# Patient Record
Sex: Female | Born: 1960 | Race: White | Hispanic: No | Marital: Married | Smoking: Never smoker
Health system: Southern US, Community
[De-identification: ages and names within clinical notes are randomized; demographics above are authoritative.]

## PROBLEM LIST (undated history)

## (undated) DIAGNOSIS — M1990 Unspecified osteoarthritis, unspecified site: Secondary | ICD-10-CM

## (undated) DIAGNOSIS — M4800 Spinal stenosis, site unspecified: Secondary | ICD-10-CM

## (undated) HISTORY — DX: Spinal stenosis, site unspecified: M48.00

## (undated) HISTORY — PX: TOOTH EXTRACTION: SUR596

## (undated) HISTORY — DX: Unspecified osteoarthritis, unspecified site: M19.90

## (undated) HISTORY — PX: WISDOM TOOTH EXTRACTION: SHX21

## (undated) HISTORY — PX: OTHER SURGICAL HISTORY: SHX169

---

## 2007-06-15 ENCOUNTER — Ambulatory Visit: Payer: Self-pay | Admitting: Internal Medicine

## 2007-09-08 ENCOUNTER — Encounter: Payer: Self-pay | Admitting: Neurological Surgery

## 2007-09-27 ENCOUNTER — Encounter: Payer: Self-pay | Admitting: Neurological Surgery

## 2008-08-26 HISTORY — PX: ABDOMINAL HYSTERECTOMY: SHX81

## 2012-06-08 LAB — HM MAMMOGRAPHY: HM Mammogram: NEGATIVE

## 2013-05-20 ENCOUNTER — Other Ambulatory Visit: Payer: Self-pay

## 2013-05-20 DIAGNOSIS — Z1231 Encounter for screening mammogram for malignant neoplasm of breast: Secondary | ICD-10-CM

## 2013-07-07 ENCOUNTER — Ambulatory Visit
Admission: RE | Admit: 2013-07-07 | Discharge: 2013-07-07 | Disposition: A | Discharge status: Home / Self Care | Payer: BC Managed Care – PPO | Source: Ambulatory Visit

## 2013-07-07 DIAGNOSIS — Z1231 Encounter for screening mammogram for malignant neoplasm of breast: Secondary | ICD-10-CM

## 2013-08-31 ENCOUNTER — Encounter: Payer: Self-pay | Admitting: Family Medicine

## 2013-08-31 ENCOUNTER — Ambulatory Visit (INDEPENDENT_AMBULATORY_CARE_PROVIDER_SITE_OTHER): Payer: BC Managed Care – PPO | Admitting: Family Medicine

## 2013-08-31 VITALS — BP 126/78 | HR 92 | Temp 97.8°F | Ht 67.0 in | Wt 217.5 lb

## 2013-08-31 DIAGNOSIS — E669 Obesity, unspecified: Secondary | ICD-10-CM

## 2013-08-31 MED ORDER — PHENTERMINE HCL 15 MG PO CAPS: 15.0000 mg | ORAL_CAPSULE | ORAL | Status: DC

## 2013-08-31 NOTE — Patient Instructions (Signed)
Good to see you. Please come see me in one month for follow up and complete physical exam.  We will check labs at that time.

## 2013-08-31 NOTE — Assessment & Plan Note (Signed)
Discussed options. She would like to hold off on nutritionist and is willing to start slow physical activity. Discussed risks and benefits of phentermine and she would like to try it. Rx given for phentermine 15 mg daily. Follow up in 1 month- CPX at that time.

## 2013-08-31 NOTE — Progress Notes (Signed)
   Subjective:    Patient ID: Madeline Ross, female    DOB: Dec 04, 1960, 53 y.o.   MRN: [id number]  HPI  Very pleasant 53 yo female here to establish care and to discuss weight loss.  Has had issues with her weight for years but feels she weighs more than ever. Tried weight watchers in past. Loves sweets. Does not exercise. Does not have high blood pressure, DM or heart disease.  Mother had ovarian cancer- pt s/p hysterectomy and bilateral oophorectomy in 2010 due to menorrhagia and family h/o ovarian CA.  Patient Active Problem List   Diagnosis Date Noted  . Obesity, unspecified 08/31/2013   Past Medical History  Diagnosis Date  . Arthritis   . Spinal stenosis    Past Surgical History  Procedure Laterality Date  . Abdominal hysterectomy  2010  . Tooth extraction    . Wisdom tooth extraction    . Oophrectomy Bilateral    History  Substance Use Topics  . Smoking status: Never Smoker   . Smokeless tobacco: Not on file  . Alcohol Use: No   Family History  Problem Relation Age of Onset  . Arthritis Mother   . Cancer Mother   . Heart disease Mother   . Stroke Mother   . Arthritis Father   . Cancer Father   . Hypertension Father   . Diabetes Maternal Uncle   . Arthritis Maternal Grandmother   . Heart disease Maternal Grandmother   . Arthritis Maternal Grandfather   . Heart disease Maternal Grandfather   . Arthritis Paternal Grandmother   . Arthritis Paternal Grandfather    No Known Allergies No current outpatient prescriptions on file prior to visit.   No current facility-administered medications on file prior to visit.   The PMH, PSH, Social History, Family History, Medications, and allergies [facility], and have been updated if relevant.    Review of Systems See HPI No n/v/d No CP No SOB    Objective:   Physical Exam  Constitutional: She is oriented to person, place, and time. She appears well-developed and well-nourished. No distress.   Neck: Normal range of motion. Neck supple.  Cardiovascular: Normal rate, regular rhythm and normal heart sounds.   Pulmonary/Chest: Effort normal and breath sounds normal.  Abdominal: Soft.  Musculoskeletal: Normal range of motion.  Neurological: She is alert and oriented to person, place, and time. She has normal reflexes.  Skin: Skin is warm and dry.  Psychiatric: She has a normal mood and affect. Her behavior is normal. Judgment and thought content normal.   BP 126/78  Pulse 92  Temp(Src) 97.8 F (36.6 C) (Oral)  Ht 5\' 7"  (1.702 m)  Wt 217 lb 8 oz (98.657 kg)  BMI 34.06 kg/m2  SpO2 96%        Assessment & Plan:

## 2013-08-31 NOTE — Progress Notes (Signed)
Pre-visit discussion using our clinic review tool. No additional management support is needed unless otherwise documented below in the visit note.  

## 2013-09-27 ENCOUNTER — Encounter: Payer: Self-pay | Admitting: Family Medicine

## 2013-10-05 ENCOUNTER — Encounter: Payer: Self-pay | Admitting: Family Medicine

## 2013-10-05 ENCOUNTER — Ambulatory Visit (INDEPENDENT_AMBULATORY_CARE_PROVIDER_SITE_OTHER): Payer: BC Managed Care – PPO | Admitting: Family Medicine

## 2013-10-05 VITALS — BP 124/80 | HR 76 | Temp 97.7°F | Wt 203.2 lb

## 2013-10-05 DIAGNOSIS — Z136 Encounter for screening for cardiovascular disorders: Secondary | ICD-10-CM

## 2013-10-05 DIAGNOSIS — Z0000 Encounter for general adult medical examination without abnormal findings: Secondary | ICD-10-CM

## 2013-10-05 DIAGNOSIS — E669 Obesity, unspecified: Secondary | ICD-10-CM

## 2013-10-05 LAB — CBC WITH DIFFERENTIAL/PLATELET
BASOS PCT: 0.3 % (ref 0.0–3.0)
Basophils Absolute: 0 10*3/uL (ref 0.0–0.1)
Eosinophils Absolute: 0.1 10*3/uL (ref 0.0–0.7)
Eosinophils Relative: 1.1 % (ref 0.0–5.0)
HCT: 45 % (ref 36.0–46.0)
Hemoglobin: 14.9 g/dL (ref 12.0–15.0)
LYMPHS PCT: 34.9 % (ref 12.0–46.0)
Lymphs Abs: 2.7 10*3/uL (ref 0.7–4.0)
MCHC: 33.2 g/dL (ref 30.0–36.0)
MCV: 87.4 fl (ref 78.0–100.0)
Monocytes Absolute: 0.6 10*3/uL (ref 0.1–1.0)
Monocytes Relative: 8.1 % (ref 3.0–12.0)
NEUTROS PCT: 55.6 % (ref 43.0–77.0)
Neutro Abs: 4.3 10*3/uL (ref 1.4–7.7)
Platelets: 264 10*3/uL (ref 150.0–400.0)
RBC: 5.15 Mil/uL — ABNORMAL HIGH (ref 3.87–5.11)
RDW: 13.1 % (ref 11.5–14.6)
WBC: 7.8 10*3/uL (ref 4.5–10.5)

## 2013-10-05 LAB — LIPID PANEL
CHOLESTEROL: 131 mg/dL (ref 0–200)
HDL: 43.7 mg/dL (ref >39.00)
LDL Cholesterol: 73 mg/dL (ref 0–99)
Total CHOL/HDL Ratio: 3
Triglycerides: 71 mg/dL (ref 0.0–149.0)
VLDL: 14.2 mg/dL (ref 0.0–40.0)

## 2013-10-05 LAB — COMPREHENSIVE METABOLIC PANEL
ALT: 24 U/L (ref 0–35)
AST: 17 U/L (ref 0–37)
Albumin: 4 g/dL (ref 3.5–5.2)
Alkaline Phosphatase: 72 U/L (ref 39–117)
BUN: 12 mg/dL (ref 6–23)
CALCIUM: 9 mg/dL (ref 8.4–10.5)
CHLORIDE: 107 meq/L (ref 96–112)
CO2: 25 meq/L (ref 19–32)
Creatinine, Ser: 0.8 mg/dL (ref 0.4–1.2)
GFR: 82.47 mL/min (ref >60.00)
GLUCOSE: 91 mg/dL (ref 70–99)
POTASSIUM: 4.1 meq/L (ref 3.5–5.1)
Sodium: 140 mEq/L (ref 135–145)
Total Bilirubin: 0.8 mg/dL (ref 0.3–1.2)
Total Protein: 7.2 g/dL (ref 6.0–8.3)

## 2013-10-05 LAB — TSH: TSH: 1.65 u[IU]/mL (ref 0.35–5.50)

## 2013-10-05 MED ORDER — PHENTERMINE HCL 30 MG PO CAPS: 30.0000 mg | ORAL_CAPSULE | ORAL | Status: DC

## 2013-10-05 NOTE — Patient Instructions (Signed)
Great to see you. You look great!  I am so proud of you.  Please come see me in 1 month.

## 2013-10-05 NOTE — Progress Notes (Signed)
Pre-visit discussion using our clinic review tool. No additional management support is needed unless otherwise documented below in the visit note.  

## 2013-10-05 NOTE — Progress Notes (Signed)
   Subjective:    Patient ID: Madeline Ross, female    DOB: 02-Feb-1961, 53 y.o.   MRN: [id number]  HPI  Very pleasant 53 yo female here for one month follow up weight loss.   Tried weight watchers in past. Loves sweets. Does not exercise. Does not have high blood pressure, DM or heart disease.  Started phentermine 15 mg daily last month.  Feels she is doing very well.  Appetite has decreased and she is more active.  Eating more salads.  Wt Readings from Last 3 Encounters:  10/05/13 203 lb 4 oz (92.194 kg)  08/31/13 217 lb 8 oz (98.657 kg)     Patient Active Problem List   Diagnosis Date Noted  . Obesity, unspecified 08/31/2013   Past Medical History  Diagnosis Date  . Arthritis   . Spinal stenosis    Past Surgical History  Procedure Laterality Date  . Abdominal hysterectomy  2010  . Tooth extraction    . Wisdom tooth extraction    . Oophrectomy Bilateral    History  Substance Use Topics  . Smoking status: Never Smoker   . Smokeless tobacco: Not on file  . Alcohol Use: No   Family History  Problem Relation Age of Onset  . Arthritis Mother   . Cancer Mother   . Heart disease Mother   . Stroke Mother   . Arthritis Father   . Cancer Father   . Hypertension Father   . Diabetes Maternal Uncle   . Arthritis Maternal Grandmother   . Heart disease Maternal Grandmother   . Arthritis Maternal Grandfather   . Heart disease Maternal Grandfather   . Arthritis Paternal Grandmother   . Arthritis Paternal Grandfather    No Known Allergies Current Outpatient Prescriptions on File Prior to Visit  Medication Sig Dispense Refill  . aspirin 81 MG tablet Take 81 mg by mouth daily.      . Multiple Vitamins-Minerals (MULTIVITAMIN WITH MINERALS) tablet Take 1 tablet by mouth daily.      . phentermine 15 MG capsule Take 1 capsule (15 mg total) by mouth every morning.  30 capsule  0   No current facility-administered medications on file prior to visit.   The PMH, PSH,  Social History, Family History, Medications, and allergies [facility], and have been updated if relevant.    Review of Systems See HPI No n/v/d No CP No SOB No palpitations    Objective:   Physical Exam  Constitutional: She is oriented to person, place, and time. She appears well-developed and well-nourished. No distress.  Neck: Normal range of motion. Neck supple.  Cardiovascular: Normal rate, regular rhythm and normal heart sounds.   Pulmonary/Chest: Effort normal and breath sounds normal.  Abdominal: Soft.  Musculoskeletal: Normal range of motion.  Neurological: She is alert and oriented to person, place, and time. She has normal reflexes.  Skin: Skin is warm and dry.  Psychiatric: She has a normal mood and affect. Her behavior is normal. Judgment and thought content normal.   BP 124/80  Pulse 76  Temp(Src) 97.7 F (36.5 C) (Oral)  Wt 203 lb 4 oz (92.194 kg)  SpO2 98%        Assessment & Plan:

## 2013-10-05 NOTE — Assessment & Plan Note (Signed)
Improved. She would like to increase dose- rx given to pt for phentermine 30 mg daily. Follow up in 1 month.

## 2013-10-06 ENCOUNTER — Encounter: Payer: Self-pay | Admitting: *Deleted

## 2013-11-02 ENCOUNTER — Encounter: Payer: Self-pay | Admitting: Family Medicine

## 2013-11-02 ENCOUNTER — Ambulatory Visit (INDEPENDENT_AMBULATORY_CARE_PROVIDER_SITE_OTHER): Payer: BC Managed Care – PPO | Admitting: Family Medicine

## 2013-11-02 VITALS — BP 134/70 | HR 87 | Temp 97.7°F | Ht 67.0 in | Wt 201.5 lb

## 2013-11-02 DIAGNOSIS — E669 Obesity, unspecified: Secondary | ICD-10-CM

## 2013-11-02 DIAGNOSIS — Z0000 Encounter for general adult medical examination without abnormal findings: Secondary | ICD-10-CM

## 2013-11-02 MED ORDER — PHENTERMINE HCL 37.5 MG PO CAPS: 37.5000 mg | ORAL_CAPSULE | ORAL | Status: DC

## 2013-11-02 NOTE — Patient Instructions (Signed)
Great to see you. You look great. Start exercising and you feel even better!

## 2013-11-02 NOTE — Progress Notes (Signed)
Pre visit review using our clinic review tool, if applicable. No additional management support is needed unless otherwise documented below in the visit note. 

## 2013-11-02 NOTE — Assessment & Plan Note (Signed)
Doing well.  Not exercising- so I encouraged her to incorporate exercise into her weight loss plan.  She would also like to try increased dose of phentermine.  Rx given to her today for phentermine 37.5 mg daily daily. Follow up in 1 month.

## 2013-11-02 NOTE — Progress Notes (Signed)
Subjective:    Patient ID: Madeline Ross, female    DOB: [birth year], 53 y.o.   MRN: [id number]  HPI  Very pleasant 53 yo female here for CPX.  Mother had ovarian cancer- pt s/p hysterectomy and bilateral oophorectomy in 2010 due to menorrhagia and family h/o ovarian CA. Mammogram 07/15/13 Normal colonoscopy when she turned 51.    Lab Results  Component Value Date   CHOL 131 10/05/2013   HDL 43.70 10/05/2013   LDLCALC 73 10/05/2013   TRIG 71.0 10/05/2013   CHOLHDL 3 10/05/2013   Lab Results  Component Value Date   CREATININE 0.8 10/05/2013   Lab Results  Component Value Date   WBC 7.8 10/05/2013   HGB 14.9 10/05/2013   HCT 45.0 10/05/2013   MCV 87.4 10/05/2013   PLT 264.0 10/05/2013   Lab Results  Component Value Date   NA 140 10/05/2013   K 4.1 10/05/2013   CL 107 10/05/2013   CO2 25 10/05/2013     Has had issues with her weight for years but feels she weighs more than ever. Tried weight watchers in past. Loves sweets. Started phentermine two months ago.  Lost more weight initially- concerned that she has plateau ed.  Denies any CP, SOB, or palpitations. Wt Readings from Last 3 Encounters:  11/02/13 201 lb 8 oz (91.4 kg)  10/05/13 203 lb 4 oz (92.194 kg)  08/31/13 217 lb 8 oz (98.657 kg)       Patient Active Problem List   Diagnosis Date Noted  . Obesity, unspecified 08/31/2013   Past Medical History  Diagnosis Date  . Arthritis   . Spinal stenosis    Past Surgical History  Procedure Laterality Date  . Abdominal hysterectomy  2010  . Tooth extraction    . Wisdom tooth extraction    . Oophrectomy Bilateral    History  Substance Use Topics  . Smoking status: Never Smoker   . Smokeless tobacco: Not on file  . Alcohol Use: No   Family History  Problem Relation Age of Onset  . Arthritis Mother   . Cancer Mother   . Heart disease Mother   . Stroke Mother   . Arthritis Father   . Cancer Father   . Hypertension Father   . Diabetes Maternal Uncle    . Arthritis Maternal Grandmother   . Heart disease Maternal Grandmother   . Arthritis Maternal Grandfather   . Heart disease Maternal Grandfather   . Arthritis Paternal Grandmother   . Arthritis Paternal Grandfather    No Known Allergies Current Outpatient Prescriptions on File Prior to Visit  Medication Sig Dispense Refill  . aspirin 81 MG tablet Take 81 mg by mouth daily.      . Multiple Vitamins-Minerals (MULTIVITAMIN WITH MINERALS) tablet Take 1 tablet by mouth daily.      . phentermine 30 MG capsule Take 1 capsule (30 mg total) by mouth every morning.  30 capsule  0   No current facility-administered medications on file prior to visit.   The PMH, PSH, Social History, Family History, Medications, and allergies [facility], and have been updated if relevant.    Review of Systems Patient reports no  vision/ hearing changes,anorexia, weight change, fever ,adenopathy, persistant / recurrent hoarseness, swallowing issues, chest pain, edema,persistant / recurrent cough, hemoptysis, dyspnea(rest, exertional, paroxysmal nocturnal), gastrointestinal  bleeding (melena, rectal bleeding), abdominal pain, excessive heart burn, GU symptoms(dysuria, hematuria, pyuria, voiding/incontinence  Issues) syncope, focal weakness, severe memory  loss, concerning skin lesions, depression, anxiety, abnormal bruising/bleeding, major joint swelling, breast masses or abnormal vaginal bleeding.    Objective:   Physical Exam  Nursing note and vitals reviewed. Constitutional: She is oriented to person, place, and time. She appears well-developed and well-nourished. No distress.  Neck: Normal range of motion. Neck supple.  Cardiovascular: Normal rate, regular rhythm and normal heart sounds.   Pulmonary/Chest: Effort normal and breath sounds normal.  Abdominal: Soft.  Musculoskeletal: Normal range of motion.  Neurological: She is alert and oriented to person, place, and time. She has normal reflexes.   Skin: Skin is warm and dry.  Psychiatric: She has a normal mood and affect. Her behavior is normal. Judgment and thought content normal.   BP 134/70  Pulse 87  Temp(Src) 97.7 F (36.5 C) (Oral)  Ht 5\' 7"  (1.702 m)  Wt 201 lb 8 oz (91.4 kg)  BMI 31.55 kg/m2  SpO2 98%        Assessment & Plan:

## 2013-11-02 NOTE — Assessment & Plan Note (Signed)
UTD prevention. Reviewed preventive care protocols, scheduled due services, and updated immunizations Discussed nutrition, exercise, diet, and healthy lifestyle.

## 2013-12-01 ENCOUNTER — Encounter: Payer: Self-pay | Admitting: Family Medicine

## 2013-12-01 ENCOUNTER — Ambulatory Visit (INDEPENDENT_AMBULATORY_CARE_PROVIDER_SITE_OTHER): Payer: BC Managed Care – PPO | Admitting: Family Medicine

## 2013-12-01 VITALS — BP 128/78 | HR 74 | Temp 97.4°F | Wt 199.0 lb

## 2013-12-01 DIAGNOSIS — E669 Obesity, unspecified: Secondary | ICD-10-CM

## 2013-12-01 MED ORDER — PHENTERMINE HCL 37.5 MG PO CAPS: 37.5000 mg | ORAL_CAPSULE | ORAL | Status: DC

## 2013-12-01 NOTE — Assessment & Plan Note (Signed)
>  25 minutes spent in face to face time with patient, >50% spent in counselling or coordination of care We discussed ways she could incorporate exercise into her daily life.  Will continue current dose of phentermine.  Follow up in 1 month.

## 2013-12-01 NOTE — Progress Notes (Signed)
   Subjective:    Patient ID: Madeline Ross, female    DOB: [birth year], 53 y.o.   MRN: [id number]  HPI  Very pleasant 53 yo female here for 1 month follow up- obesity.   Has had issues with her weight for years.  Tried weight watchers in past. Loves sweets. Started phentermine three months ago, last month we increased her dose to 37.5 mg daily.   Denies any insomnia, CP, SOB, or palpitations. Wt Readings from Last 3 Encounters:  12/01/13 199 lb (90.266 kg)  11/02/13 201 lb 8 oz (91.4 kg)  10/05/13 203 lb 4 oz (92.194 kg)   She has had more stressors with her mother recently so she has not had time to exercise.    Patient Active Problem List   Diagnosis Date Noted  . Obesity, unspecified 08/31/2013   Past Medical History  Diagnosis Date  . Arthritis   . Spinal stenosis    Past Surgical History  Procedure Laterality Date  . Abdominal hysterectomy  2010  . Tooth extraction    . Wisdom tooth extraction    . Oophrectomy Bilateral    History  Substance Use Topics  . Smoking status: Never Smoker   . Smokeless tobacco: Not on file  . Alcohol Use: No   Family History  Problem Relation Age of Onset  . Arthritis Mother   . Cancer Mother   . Heart disease Mother   . Stroke Mother   . Arthritis Father   . Cancer Father   . Hypertension Father   . Diabetes Maternal Uncle   . Arthritis Maternal Grandmother   . Heart disease Maternal Grandmother   . Arthritis Maternal Grandfather   . Heart disease Maternal Grandfather   . Arthritis Paternal Grandmother   . Arthritis Paternal Grandfather    No Known Allergies Current Outpatient Prescriptions on File Prior to Visit  Medication Sig Dispense Refill  . aspirin 81 MG tablet Take 81 mg by mouth daily.      . Multiple Vitamins-Minerals (MULTIVITAMIN WITH MINERALS) tablet Take 1 tablet by mouth daily.      . phentermine 37.5 MG capsule Take 1 capsule (37.5 mg total) by mouth every morning.  30 capsule  0   No current  facility-administered medications on file prior to visit.   The PMH, PSH, Social History, Family History, Medications, and allergies [facility], and have been updated if relevant.    Review of Systems See HPI Objective:   Physical Exam  Nursing note and vitals reviewed. Constitutional: She is oriented to person, place, and time. She appears well-developed and well-nourished. No distress.  Neck: Normal range of motion. Neck supple.  Cardiovascular: Normal rate, regular rhythm and normal heart sounds.   Pulmonary/Chest: Effort normal and breath sounds normal.  Abdominal: Soft.  Musculoskeletal: Normal range of motion.  Neurological: She is alert and oriented to person, place, and time. She has normal reflexes.  Skin: Skin is warm and dry.  Psychiatric: She has a normal mood and affect. Her behavior is normal. Judgment and thought content normal.   BP 128/78  Pulse 74  Temp(Src) 97.4 F (36.3 C) (Oral)  Wt 199 lb (90.266 kg)  SpO2 98%        Assessment & Plan:

## 2013-12-01 NOTE — Patient Instructions (Signed)
Great to see you. Hang in there.  Thank you so much for my soap.  We will see you next month.

## 2013-12-01 NOTE — Progress Notes (Signed)
Pre visit review using our clinic review tool, if applicable. No additional management support is needed unless otherwise documented below in the visit note. 

## 2013-12-29 ENCOUNTER — Encounter: Payer: Self-pay | Admitting: Family Medicine

## 2013-12-29 ENCOUNTER — Ambulatory Visit (INDEPENDENT_AMBULATORY_CARE_PROVIDER_SITE_OTHER): Payer: BC Managed Care – PPO | Admitting: Family Medicine

## 2013-12-29 VITALS — BP 126/76 | HR 78 | Temp 98.0°F | Wt 193.0 lb

## 2013-12-29 DIAGNOSIS — E669 Obesity, unspecified: Secondary | ICD-10-CM

## 2013-12-29 MED ORDER — PHENTERMINE HCL 37.5 MG PO CAPS: 37.5000 mg | ORAL_CAPSULE | ORAL | Status: DC

## 2013-12-29 NOTE — Progress Notes (Signed)
Pre visit review using our clinic review tool, if applicable. No additional management support is needed unless otherwise documented below in the visit note. 

## 2013-12-29 NOTE — Patient Instructions (Signed)
Great to see you! Try to increase your physical activity.  Come see me in one month.

## 2013-12-29 NOTE — Progress Notes (Signed)
   Subjective:    Patient ID: Madeline Ross, female    DOB: Nov 02, 1960, 53 y.o.   MRN: [id number]  HPI  Very pleasant 53 yo female here for 1 month follow up- obesity.   Has had issues with her weight for years.  Tried weight watchers in past. Loves sweets. On phentermine 37.5 mg daily.   Denies any insomnia, CP, SOB, or palpitations.  She is working in yard but not formally exercising. Wt Readings from Last 3 Encounters:  12/29/13 193 lb (87.544 kg)  12/01/13 199 lb (90.266 kg)  11/02/13 201 lb 8 oz (91.4 kg)   She has had more stressors with her mother recently so she has not had time to exercise.  Does feel she is stress eating less.  Clothes fit better.   Patient Active Problem List   Diagnosis Date Noted  . Obesity, unspecified 08/31/2013   Past Medical History  Diagnosis Date  . Arthritis   . Spinal stenosis    Past Surgical History  Procedure Laterality Date  . Abdominal hysterectomy  2010  . Tooth extraction    . Wisdom tooth extraction    . Oophrectomy Bilateral    History  Substance Use Topics  . Smoking status: Never Smoker   . Smokeless tobacco: Not on file  . Alcohol Use: No   Family History  Problem Relation Age of Onset  . Arthritis Mother   . Cancer Mother   . Heart disease Mother   . Stroke Mother   . Arthritis Father   . Cancer Father   . Hypertension Father   . Diabetes Maternal Uncle   . Arthritis Maternal Grandmother   . Heart disease Maternal Grandmother   . Arthritis Maternal Grandfather   . Heart disease Maternal Grandfather   . Arthritis Paternal Grandmother   . Arthritis Paternal Grandfather    No Known Allergies Current Outpatient Prescriptions on File Prior to Visit  Medication Sig Dispense Refill  . aspirin 81 MG tablet Take 81 mg by mouth daily.      . Multiple Vitamins-Minerals (MULTIVITAMIN WITH MINERALS) tablet Take 1 tablet by mouth daily.      . phentermine 37.5 MG capsule Take 1 capsule (37.5 mg total) by mouth  every morning.  30 capsule  0   No current facility-administered medications on file prior to visit.   The PMH, PSH, Social History, Family History, Medications, and allergies [facility], and have been updated if relevant.    Review of Systems See HPI Objective:   Physical Exam  Nursing note and vitals reviewed. Constitutional: She is oriented to person, place, and time. She appears well-developed and well-nourished. No distress.  Neck: Normal range of motion. Neck supple.  Cardiovascular: Normal rate, regular rhythm and normal heart sounds.   Pulmonary/Chest: Effort normal and breath sounds normal.  Abdominal: Soft.  Musculoskeletal: Normal range of motion.  Neurological: She is alert and oriented to person, place, and time. She has normal reflexes.  Skin: Skin is warm and dry.  Psychiatric: She has a normal mood and affect. Her behavior is normal. Judgment and thought content normal.   BP 126/76  Pulse 78  Temp(Src) 98 F (36.7 C) (Oral)  Wt 193 lb (87.544 kg)  SpO2 99%        Assessment & Plan:

## 2013-12-29 NOTE — Assessment & Plan Note (Signed)
Improved.  BMI 30. Continue phentermine 37.5 mg daily.  Follow up in 1 month.  If BMI < 27 will decrease to half dose x 1 month then stop

## 2014-01-26 ENCOUNTER — Encounter: Payer: Self-pay | Admitting: Family Medicine

## 2014-01-26 ENCOUNTER — Ambulatory Visit (INDEPENDENT_AMBULATORY_CARE_PROVIDER_SITE_OTHER): Payer: BC Managed Care – PPO | Admitting: Family Medicine

## 2014-01-26 ENCOUNTER — Encounter (INDEPENDENT_AMBULATORY_CARE_PROVIDER_SITE_OTHER): Payer: Self-pay

## 2014-01-26 VITALS — BP 122/72 | HR 85 | Temp 97.7°F | Wt 192.0 lb

## 2014-01-26 DIAGNOSIS — E669 Obesity, unspecified: Secondary | ICD-10-CM

## 2014-01-26 DIAGNOSIS — H029 Unspecified disorder of eyelid: Secondary | ICD-10-CM

## 2014-01-26 MED ORDER — PHENTERMINE HCL 37.5 MG PO CAPS: 37.5000 mg | ORAL_CAPSULE | ORAL | Status: DC

## 2014-01-26 NOTE — Assessment & Plan Note (Signed)
Phentermine rx refilled. Follow up in 1 month.

## 2014-01-26 NOTE — Patient Instructions (Signed)
Please stop by to see Madeline Ross on your way out. 

## 2014-01-26 NOTE — Progress Notes (Signed)
   Subjective:    Patient ID: Madeline Ross, female    DOB: 10-26-1960, 53 y.o.   MRN: [id number]  HPI  Very pleasant 53 yo female here for 1 month follow up- obesity.   Has had issues with her weight for years.  Tried weight watchers in past. Loves sweets. On phentermine 37.5 mg daily.   Denies any insomnia, CP, SOB, or palpitations.  She is working in yard but not formally exercising. Wt Readings from Last 3 Encounters:  01/26/14 192 lb (87.091 kg)  12/29/13 193 lb (87.544 kg)  12/01/13 199 lb (90.266 kg)   She has had more stressors with her mother recently so she has not had time to exercise.  Does feel she is stress eating less.  Clothes fit better.  Has a right eye lid lesion- was told she needs Mohs surgery.   Patient Active Problem List   Diagnosis Date Noted  . Obesity, unspecified 08/31/2013   Past Medical History  Diagnosis Date  . Arthritis   . Spinal stenosis    Past Surgical History  Procedure Laterality Date  . Abdominal hysterectomy  2010  . Tooth extraction    . Wisdom tooth extraction    . Oophrectomy Bilateral    History  Substance Use Topics  . Smoking status: Never Smoker   . Smokeless tobacco: Not on file  . Alcohol Use: No   Family History  Problem Relation Age of Onset  . Arthritis Mother   . Cancer Mother   . Heart disease Mother   . Stroke Mother   . Arthritis Father   . Cancer Father   . Hypertension Father   . Diabetes Maternal Uncle   . Arthritis Maternal Grandmother   . Heart disease Maternal Grandmother   . Arthritis Maternal Grandfather   . Heart disease Maternal Grandfather   . Arthritis Paternal Grandmother   . Arthritis Paternal Grandfather    No Known Allergies Current Outpatient Prescriptions on File Prior to Visit  Medication Sig Dispense Refill  . aspirin 81 MG tablet Take 81 mg by mouth daily.      . Multiple Vitamins-Minerals (MULTIVITAMIN WITH MINERALS) tablet Take 1 tablet by mouth daily.       No current  facility-administered medications on file prior to visit.   The PMH, PSH, Social History, Family History, Medications, and allergies have been reviewed in [facility], and have been updated if relevant.    Review of Systems See HPI Objective:   Physical Exam  Nursing note and vitals reviewed. Constitutional: She is oriented to person, place, and time. She appears well-developed and well-nourished. No distress.  Neck: Normal range of motion. Neck supple.  Cardiovascular: Normal rate, regular rhythm and normal heart sounds.   Pulmonary/Chest: Effort normal and breath sounds normal.  Abdominal: Soft.  Musculoskeletal: Normal range of motion.  Neurological: She is alert and oriented to person, place, and time. She has normal reflexes.  Skin: Skin is warm and dry.  Psychiatric: She has a normal mood and affect. Her behavior is normal. Judgment and thought content normal.   Right eye lid- circular, flesh colored lesion BP 122/72  Pulse 85  Temp(Src) 97.7 F (36.5 C) (Oral)  Wt 192 lb (87.091 kg)  SpO2 98%        Assessment & Plan:

## 2014-01-26 NOTE — Progress Notes (Signed)
Pre visit review using our clinic review tool, if applicable. No additional management support is needed unless otherwise documented below in the visit note. 

## 2014-02-21 ENCOUNTER — Other Ambulatory Visit: Payer: Self-pay

## 2014-02-21 MED ORDER — PHENTERMINE HCL 37.5 MG PO CAPS: 37.5000 mg | ORAL_CAPSULE | ORAL | Status: DC

## 2014-02-21 NOTE — Telephone Encounter (Signed)
Ok to phone in.

## 2014-02-21 NOTE — Telephone Encounter (Signed)
Pt had f/u appt on 02/22/14 but pt cannot come and cancelled appt; pt rescheduled for 03/14/14 with Dr Dayton MartesAron; this was first available because pt's vacation time. Pt request rx phentermine.pt request cb.

## 2014-02-21 NOTE — Telephone Encounter (Signed)
Spoke to pt and informed her Rx has been faxed to requested pharmacy 

## 2014-02-22 ENCOUNTER — Ambulatory Visit: Payer: BC Managed Care – PPO | Admitting: Family Medicine

## 2014-03-07 ENCOUNTER — Ambulatory Visit: Payer: BC Managed Care – PPO | Admitting: Family Medicine

## 2014-03-14 ENCOUNTER — Ambulatory Visit: Payer: BC Managed Care – PPO | Admitting: Family Medicine

## 2014-03-15 ENCOUNTER — Ambulatory Visit: Payer: BC Managed Care – PPO | Admitting: Family Medicine

## 2014-05-03 ENCOUNTER — Other Ambulatory Visit: Payer: Self-pay

## 2014-05-03 ENCOUNTER — Ambulatory Visit (INDEPENDENT_AMBULATORY_CARE_PROVIDER_SITE_OTHER): Payer: BC Managed Care – PPO | Admitting: *Deleted

## 2014-05-03 DIAGNOSIS — Z1231 Encounter for screening mammogram for malignant neoplasm of breast: Secondary | ICD-10-CM

## 2014-05-03 DIAGNOSIS — Z23 Encounter for immunization: Secondary | ICD-10-CM

## 2014-07-12 ENCOUNTER — Ambulatory Visit
Admission: RE | Admit: 2014-07-12 | Discharge: 2014-07-12 | Disposition: A | Discharge status: Home / Self Care | Payer: BC Managed Care – PPO | Source: Ambulatory Visit

## 2014-07-12 ENCOUNTER — Encounter (INDEPENDENT_AMBULATORY_CARE_PROVIDER_SITE_OTHER): Payer: Self-pay

## 2014-07-12 DIAGNOSIS — Z1231 Encounter for screening mammogram for malignant neoplasm of breast: Secondary | ICD-10-CM

## 2015-05-24 ENCOUNTER — Other Ambulatory Visit: Payer: Self-pay

## 2015-05-24 DIAGNOSIS — Z1231 Encounter for screening mammogram for malignant neoplasm of breast: Secondary | ICD-10-CM

## 2015-07-18 ENCOUNTER — Ambulatory Visit: Payer: Self-pay

## 2015-11-01 ENCOUNTER — Ambulatory Visit: Payer: Self-pay

## 2015-12-13 ENCOUNTER — Ambulatory Visit: Payer: Self-pay

## 2015-12-26 ENCOUNTER — Ambulatory Visit: Payer: Self-pay | Admitting: Nurse Practitioner

## 2016-01-23 ENCOUNTER — Ambulatory Visit
Admission: RE | Admit: 2016-01-23 | Discharge: 2016-01-23 | Disposition: A | Discharge status: Home / Self Care | Payer: BLUE CROSS/BLUE SHIELD | Source: Ambulatory Visit

## 2016-01-23 DIAGNOSIS — Z1231 Encounter for screening mammogram for malignant neoplasm of breast: Secondary | ICD-10-CM

## 2016-05-15 ENCOUNTER — Encounter: Payer: Self-pay | Admitting: Family Medicine

## 2016-05-15 ENCOUNTER — Ambulatory Visit (INDEPENDENT_AMBULATORY_CARE_PROVIDER_SITE_OTHER): Payer: BLUE CROSS/BLUE SHIELD | Admitting: Family Medicine

## 2016-05-15 VITALS — BP 136/78 | HR 72 | Temp 98.1°F | Ht 67.0 in | Wt 210.5 lb

## 2016-05-15 DIAGNOSIS — Z23 Encounter for immunization: Secondary | ICD-10-CM

## 2016-05-15 DIAGNOSIS — Z0000 Encounter for general adult medical examination without abnormal findings: Secondary | ICD-10-CM | POA: Diagnosis not present

## 2016-05-15 DIAGNOSIS — Z01419 Encounter for gynecological examination (general) (routine) without abnormal findings: Secondary | ICD-10-CM | POA: Insufficient documentation

## 2016-05-15 LAB — CBC WITH DIFFERENTIAL/PLATELET
Basophils Absolute: 0 10*3/uL (ref 0.0–0.1)
Basophils Relative: 0.4 % (ref 0.0–3.0)
EOS ABS: 0.1 10*3/uL (ref 0.0–0.7)
Eosinophils Relative: 1.4 % (ref 0.0–5.0)
HEMATOCRIT: 43.1 % (ref 36.0–46.0)
HEMOGLOBIN: 14.8 g/dL (ref 12.0–15.0)
LYMPHS PCT: 30.9 % (ref 12.0–46.0)
Lymphs Abs: 2.5 10*3/uL (ref 0.7–4.0)
MCHC: 34.4 g/dL (ref 30.0–36.0)
MCV: 85.3 fl (ref 78.0–100.0)
Monocytes Absolute: 0.6 10*3/uL (ref 0.1–1.0)
Monocytes Relative: 7.3 % (ref 3.0–12.0)
Neutro Abs: 4.9 10*3/uL (ref 1.4–7.7)
Neutrophils Relative %: 60 % (ref 43.0–77.0)
Platelets: 279 10*3/uL (ref 150.0–400.0)
RBC: 5.05 Mil/uL (ref 3.87–5.11)
RDW: 13.2 % (ref 11.5–15.5)
WBC: 8.2 10*3/uL (ref 4.0–10.5)

## 2016-05-15 LAB — LIPID PANEL
CHOLESTEROL: 144 mg/dL (ref 0–200)
HDL: 48.8 mg/dL (ref >39.00)
LDL Cholesterol: 82 mg/dL (ref 0–99)
NonHDL: 94.99
TRIGLYCERIDES: 65 mg/dL (ref 0.0–149.0)
Total CHOL/HDL Ratio: 3
VLDL: 13 mg/dL (ref 0.0–40.0)

## 2016-05-15 LAB — COMPREHENSIVE METABOLIC PANEL
ALBUMIN: 4 g/dL (ref 3.5–5.2)
ALK PHOS: 85 U/L (ref 39–117)
ALT: 19 U/L (ref 0–35)
AST: 17 U/L (ref 0–37)
BUN: 14 mg/dL (ref 6–23)
CALCIUM: 8.9 mg/dL (ref 8.4–10.5)
CHLORIDE: 107 meq/L (ref 96–112)
CO2: 29 mEq/L (ref 19–32)
CREATININE: 0.81 mg/dL (ref 0.40–1.20)
GFR: 78.18 mL/min (ref >60.00)
Glucose, Bld: 90 mg/dL (ref 70–99)
POTASSIUM: 4.2 meq/L (ref 3.5–5.1)
Sodium: 141 mEq/L (ref 135–145)
TOTAL PROTEIN: 7 g/dL (ref 6.0–8.3)
Total Bilirubin: 0.5 mg/dL (ref 0.2–1.2)

## 2016-05-15 LAB — TSH: TSH: 1.82 u[IU]/mL (ref 0.35–4.50)

## 2016-05-15 MED ORDER — PHENTERMINE HCL 37.5 MG PO CAPS: 37.5000 mg | ORAL_CAPSULE | ORAL | 0 refills | Status: AC

## 2016-05-15 NOTE — Assessment & Plan Note (Signed)
Reviewed preventive care protocols, scheduled due services, and updated immunizations Discussed nutrition, exercise, diet, and healthy lifestyle.  Orders Placed This Encounter  Procedures  . Flu Vaccine QUAD 36+ mos PF IM (Fluarix & Fluzone Quad PF)  . CBC with Differential/Platelet  . Comprehensive metabolic panel  . Lipid panel  . TSH  . Hepatitis C Antibody  . HIV antibody (with reflex)

## 2016-05-15 NOTE — Patient Instructions (Addendum)
Good to see you. We will call you with your lab results and you can view them online.  We are restarting phentermine. Please come see me in 1 month.

## 2016-05-15 NOTE — Progress Notes (Signed)
Subjective:    Patient ID: Madeline Ross, female    DOB: July 30, 1961, 55 y.o.   MRN: [id number]  HPI  Very pleasant 55 yo female here for CPX.  Mother had ovarian cancer- pt s/p hysterectomy and bilateral oophorectomy in 2010 due to menorrhagia and family h/o ovarian CA. Mammogram 01/24/16 Normal colonoscopy when she turned 51.  Continues to gain weight.  A lot of stressors with her mom. Phentermine did help in past without side effects.  Wt Readings from Last 3 Encounters:  05/15/16 210 lb 8 oz (95.5 kg)  01/26/14 192 lb (87.1 kg)  12/29/13 193 lb (87.5 kg)    Lab Results  Component Value Date   CHOL 131 10/05/2013   HDL 43.70 10/05/2013   LDLCALC 73 10/05/2013   TRIG 71.0 10/05/2013   CHOLHDL 3 10/05/2013   Lab Results  Component Value Date   CREATININE 0.8 10/05/2013   Lab Results  Component Value Date   WBC 7.8 10/05/2013   HGB 14.9 10/05/2013   HCT 45.0 10/05/2013   MCV 87.4 10/05/2013   PLT 264.0 10/05/2013   Lab Results  Component Value Date   NA 140 10/05/2013   K 4.1 10/05/2013   CL 107 10/05/2013   CO2 25 10/05/2013     Has had issues with her weight for years but feels she weighs more than ever. Tried weight watchers in past. Loves sweets. Started phentermine two months ago.  Lost more weight initially- concerned that she has plateau ed.  Denies any CP, SOB, or palpitations. Wt Readings from Last 3 Encounters:  05/15/16 210 lb 8 oz (95.5 kg)  01/26/14 192 lb (87.1 kg)  12/29/13 193 lb (87.5 kg)       Patient Active Problem List   Diagnosis Date Noted  . Well woman exam 05/15/2016   Past Medical History:  Diagnosis Date  . Arthritis   . Spinal stenosis    Past Surgical History:  Procedure Laterality Date  . ABDOMINAL HYSTERECTOMY  2010  . oophrectomy Bilateral   . TOOTH EXTRACTION    . WISDOM TOOTH EXTRACTION     Social History  Substance Use Topics  . Smoking status: Never Smoker  . Smokeless tobacco: Not on file  .  Alcohol use No   Family History  Problem Relation Age of Onset  . Arthritis Mother   . Cancer Mother   . Heart disease Mother   . Stroke Mother   . Arthritis Father   . Cancer Father   . Hypertension Father   . Diabetes Maternal Uncle   . Arthritis Maternal Grandmother   . Heart disease Maternal Grandmother   . Arthritis Maternal Grandfather   . Heart disease Maternal Grandfather   . Arthritis Paternal Grandmother   . Arthritis Paternal Grandfather    No Known Allergies Current Outpatient Prescriptions on File Prior to Visit  Medication Sig Dispense Refill  . aspirin 81 MG tablet Take 81 mg by mouth daily.    . Multiple Vitamins-Minerals (MULTIVITAMIN WITH MINERALS) tablet Take 1 tablet by mouth daily.     No current facility-administered medications on file prior to visit.    The PMH, PSH, Social History, Family History, Medications, and allergies have been reviewed in [facility], and have been updated if relevant.    Review of Systems  Constitutional: Negative.   HENT: Negative.   Eyes: Negative.   Respiratory: Negative.   Cardiovascular: Negative.   Gastrointestinal: Negative.   Endocrine: Negative.   Musculoskeletal:  Negative.   Skin: Negative.   Allergic/Immunologic: Negative.   Neurological: Negative.   Hematological: Negative.   Psychiatric/Behavioral: Negative.   All other systems reviewed and are negative.    Objective:   Physical Exam  Constitutional: She is oriented to person, place, and time. She appears well-developed and well-nourished. No distress.  Neck: Normal range of motion. Neck supple.  Cardiovascular: Normal rate, regular rhythm and normal heart sounds.   Pulmonary/Chest: Effort normal and breath sounds normal.  Abdominal: Soft.  Musculoskeletal: Normal range of motion.  Neurological: She is alert and oriented to person, place, and time. She has normal reflexes.  Skin: Skin is warm and dry.  Psychiatric: She has a normal mood and affect. Her  behavior is normal. Judgment and thought content normal.  Nursing note and vitals reviewed.  BP 136/78   Pulse 72   Temp 98.1 F (36.7 C) (Oral)   Ht 5\' 7"  (1.702 m)   Wt 210 lb 8 oz (95.5 kg)   SpO2 97%   BMI 32.97 kg/m         Assessment & Plan:

## 2016-05-15 NOTE — Assessment & Plan Note (Signed)
Discussed weight loss plan. Pt would also like to discuss medication options- discussed phentermine risk benefits, side effects including HTN, pulmonary HTN, stroke.    She would like to restart phentermine and lifestyle changes.  Follow up in 1 month.  If BMI < 27 will decrease to half dose x 1 month then stop   

## 2016-05-15 NOTE — Progress Notes (Signed)
Pre visit review using our clinic review tool, if applicable. No additional management support is needed unless otherwise documented below in the visit note. 

## 2016-05-16 LAB — HIV ANTIBODY (ROUTINE TESTING W REFLEX): HIV 1&2 Ab, 4th Generation: NONREACTIVE

## 2016-05-16 LAB — HEPATITIS C ANTIBODY: HCV Ab: NEGATIVE

## 2016-05-17 ENCOUNTER — Encounter: Payer: Self-pay | Admitting: *Deleted

## 2016-06-12 ENCOUNTER — Ambulatory Visit: Payer: BLUE CROSS/BLUE SHIELD | Admitting: Family Medicine

## 2016-07-01 ENCOUNTER — Ambulatory Visit: Payer: BLUE CROSS/BLUE SHIELD | Admitting: Family Medicine

## 2017-10-30 ENCOUNTER — Other Ambulatory Visit: Payer: Self-pay | Admitting: Family Medicine

## 2017-10-30 DIAGNOSIS — Z139 Encounter for screening, unspecified: Secondary | ICD-10-CM

## 2018-01-05 ENCOUNTER — Ambulatory Visit: Payer: BLUE CROSS/BLUE SHIELD

## 2018-04-06 ENCOUNTER — Ambulatory Visit: Payer: BLUE CROSS/BLUE SHIELD

## 2018-05-19 ENCOUNTER — Ambulatory Visit: Payer: Self-pay

## 2018-06-08 ENCOUNTER — Ambulatory Visit
Admission: RE | Admit: 2018-06-08 | Discharge: 2018-06-08 | Disposition: A | Discharge status: Home / Self Care | Payer: BLUE CROSS/BLUE SHIELD | Source: Ambulatory Visit | Attending: Family Medicine | Admitting: Family Medicine

## 2018-06-08 DIAGNOSIS — Z139 Encounter for screening, unspecified: Secondary | ICD-10-CM

## 2019-07-26 ENCOUNTER — Other Ambulatory Visit: Payer: Self-pay | Admitting: Family Medicine

## 2019-07-26 DIAGNOSIS — Z1231 Encounter for screening mammogram for malignant neoplasm of breast: Secondary | ICD-10-CM

## 2019-09-15 ENCOUNTER — Other Ambulatory Visit: Payer: Self-pay

## 2019-09-15 ENCOUNTER — Ambulatory Visit
Admission: RE | Admit: 2019-09-15 | Discharge: 2019-09-15 | Disposition: A | Discharge status: Home / Self Care | Payer: 59 | Source: Ambulatory Visit | Attending: Family Medicine | Admitting: Family Medicine

## 2019-09-15 DIAGNOSIS — Z1231 Encounter for screening mammogram for malignant neoplasm of breast: Secondary | ICD-10-CM

## 2021-03-26 ENCOUNTER — Other Ambulatory Visit: Payer: Self-pay | Admitting: Family Medicine

## 2021-03-26 DIAGNOSIS — Z1231 Encounter for screening mammogram for malignant neoplasm of breast: Secondary | ICD-10-CM

## 2021-05-15 ENCOUNTER — Ambulatory Visit
Admission: RE | Admit: 2021-05-15 | Discharge: 2021-05-15 | Disposition: A | Discharge status: Home / Self Care | Payer: BC Managed Care – PPO | Source: Ambulatory Visit | Attending: Family Medicine | Admitting: Family Medicine

## 2021-05-15 ENCOUNTER — Other Ambulatory Visit: Payer: Self-pay

## 2021-05-15 DIAGNOSIS — Z1231 Encounter for screening mammogram for malignant neoplasm of breast: Secondary | ICD-10-CM

## 2022-04-17 ENCOUNTER — Other Ambulatory Visit: Payer: Self-pay | Admitting: Orthopedic Surgery

## 2022-04-17 DIAGNOSIS — M25362 Other instability, left knee: Secondary | ICD-10-CM

## 2022-04-17 DIAGNOSIS — M25462 Effusion, left knee: Secondary | ICD-10-CM

## 2022-05-06 ENCOUNTER — Ambulatory Visit: Payer: Worker's Compensation

## 2022-05-13 ENCOUNTER — Encounter
Admission: RE | Admit: 2022-05-13 | Discharge: 2022-05-13 | Disposition: A | Discharge status: Home / Self Care | Payer: Worker's Compensation | Source: Ambulatory Visit | Attending: Orthopedic Surgery | Admitting: Orthopedic Surgery

## 2022-05-13 DIAGNOSIS — M25462 Effusion, left knee: Secondary | ICD-10-CM | POA: Diagnosis present

## 2022-05-13 DIAGNOSIS — M25362 Other instability, left knee: Secondary | ICD-10-CM | POA: Insufficient documentation

## 2022-05-13 MED ORDER — TECHNETIUM TC 99M MEDRONATE IV KIT
20.0000 | PACK | Freq: Once | INTRAVENOUS | Status: AC | PRN
  Administered 2022-05-13: 20.6 via INTRAVENOUS

## 2022-05-22 ENCOUNTER — Other Ambulatory Visit: Payer: Self-pay | Admitting: Family Medicine

## 2022-05-22 DIAGNOSIS — Z1231 Encounter for screening mammogram for malignant neoplasm of breast: Secondary | ICD-10-CM

## 2022-07-01 ENCOUNTER — Ambulatory Visit
Admission: RE | Admit: 2022-07-01 | Discharge: 2022-07-01 | Disposition: A | Discharge status: Home / Self Care | Payer: BC Managed Care – PPO | Source: Ambulatory Visit | Attending: Family Medicine | Admitting: Family Medicine

## 2022-07-01 DIAGNOSIS — Z1231 Encounter for screening mammogram for malignant neoplasm of breast: Secondary | ICD-10-CM

## 2022-07-03 ENCOUNTER — Other Ambulatory Visit: Payer: Self-pay | Admitting: Family Medicine

## 2022-07-03 DIAGNOSIS — R928 Other abnormal and inconclusive findings on diagnostic imaging of breast: Secondary | ICD-10-CM

## 2022-07-17 ENCOUNTER — Ambulatory Visit
Admission: RE | Admit: 2022-07-17 | Discharge: 2022-07-17 | Disposition: A | Discharge status: Home / Self Care | Payer: BC Managed Care – PPO | Source: Ambulatory Visit | Attending: Family Medicine | Admitting: Family Medicine

## 2022-07-17 DIAGNOSIS — R928 Other abnormal and inconclusive findings on diagnostic imaging of breast: Secondary | ICD-10-CM

## 2022-11-13 IMAGING — MG MM DIGITAL SCREENING BILAT W/ TOMO AND CAD
8 series · 8 of 24 positions shown · non-contrast
Comparison: Previous exam(s).

CLINICAL DATA: Screening.

EXAM:
DIGITAL SCREENING BILATERAL MAMMOGRAM WITH TOMOSYNTHESIS AND CAD
TECHNIQUE: Bilateral screening digital craniocaudal and mediolateral oblique
mammograms were obtained. Bilateral screening digital breast
tomosynthesis was performed. The images were evaluated with
computer-aided detection.

[L CC synth-2D]
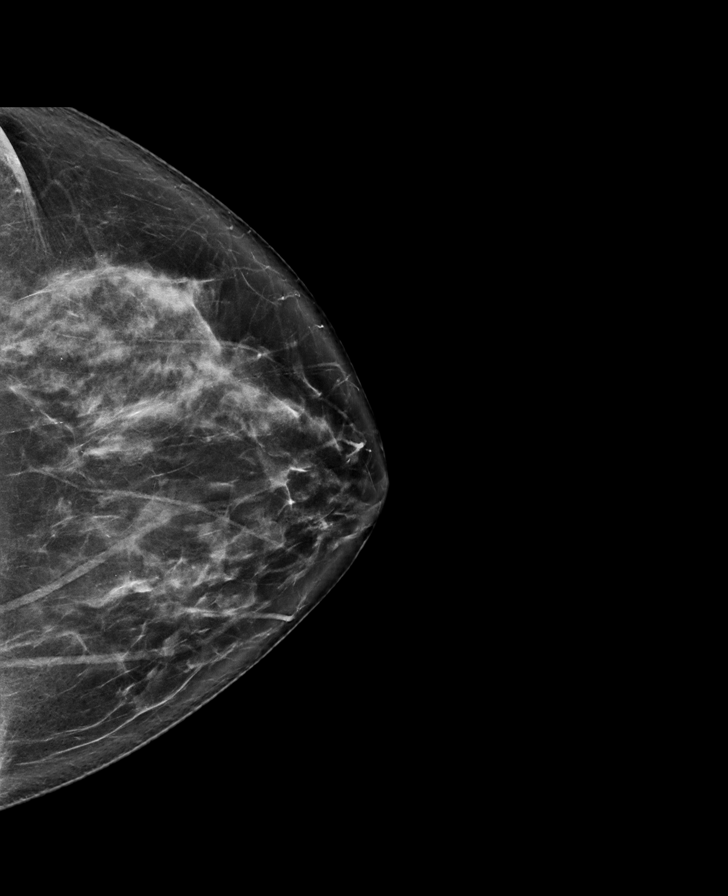

[R CC synth-2D]
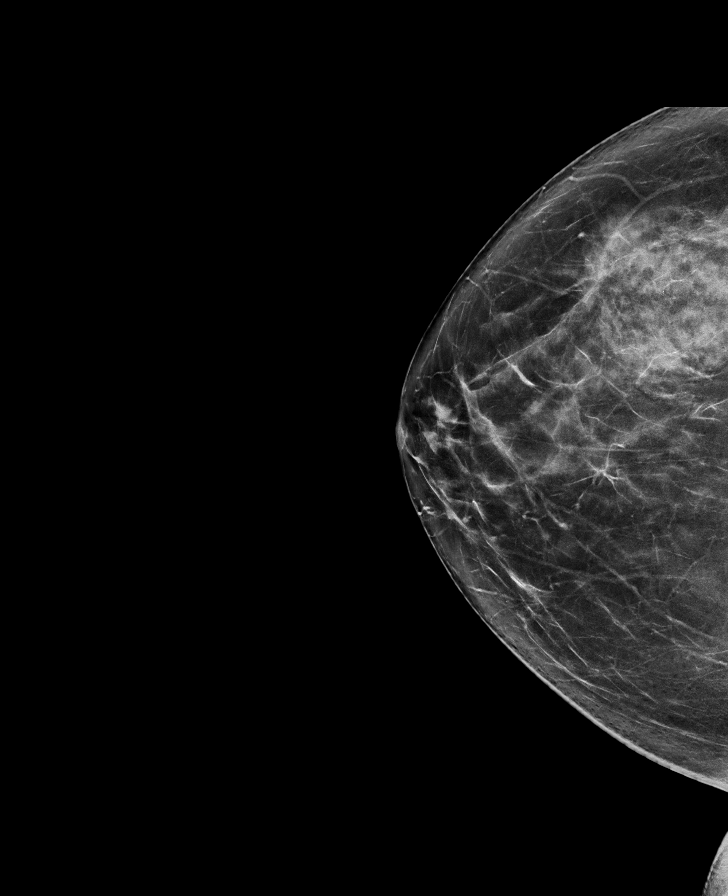

[L MLO synth-2D]
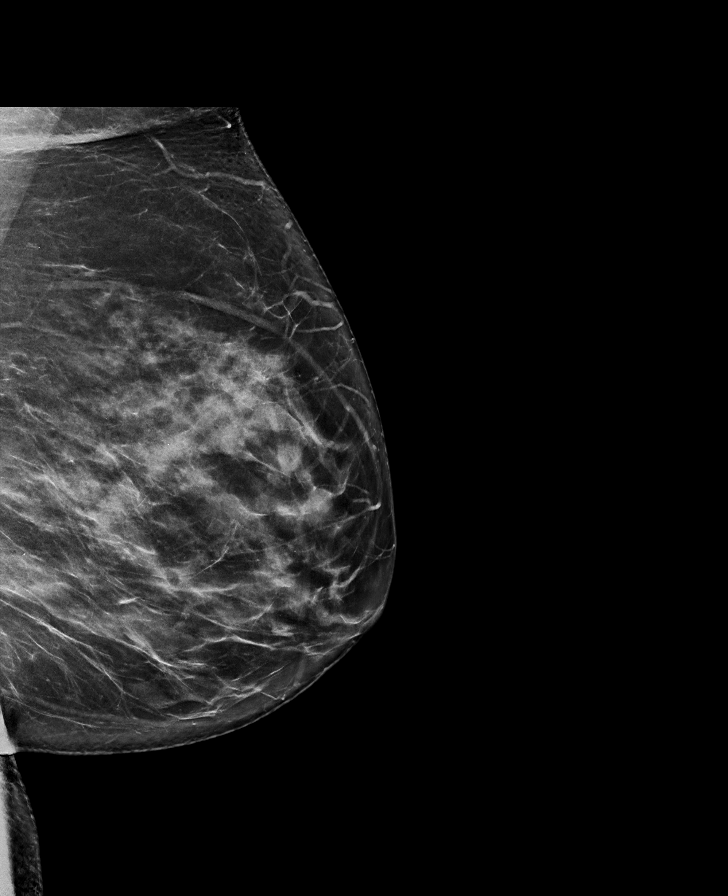

[R MLO synth-2D]
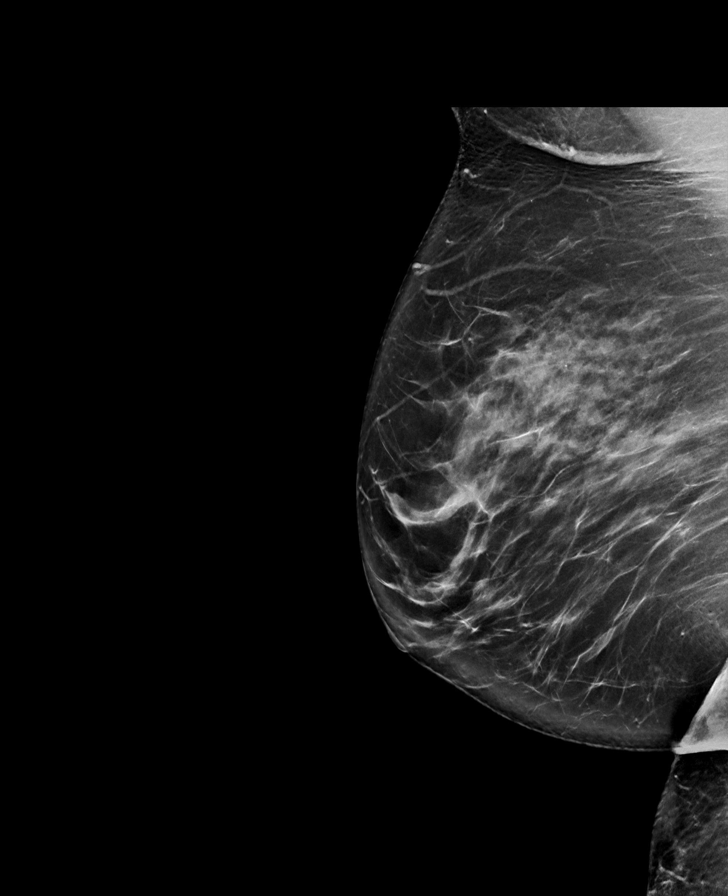

[R MLO tomo · tomo slice 45/89.0]
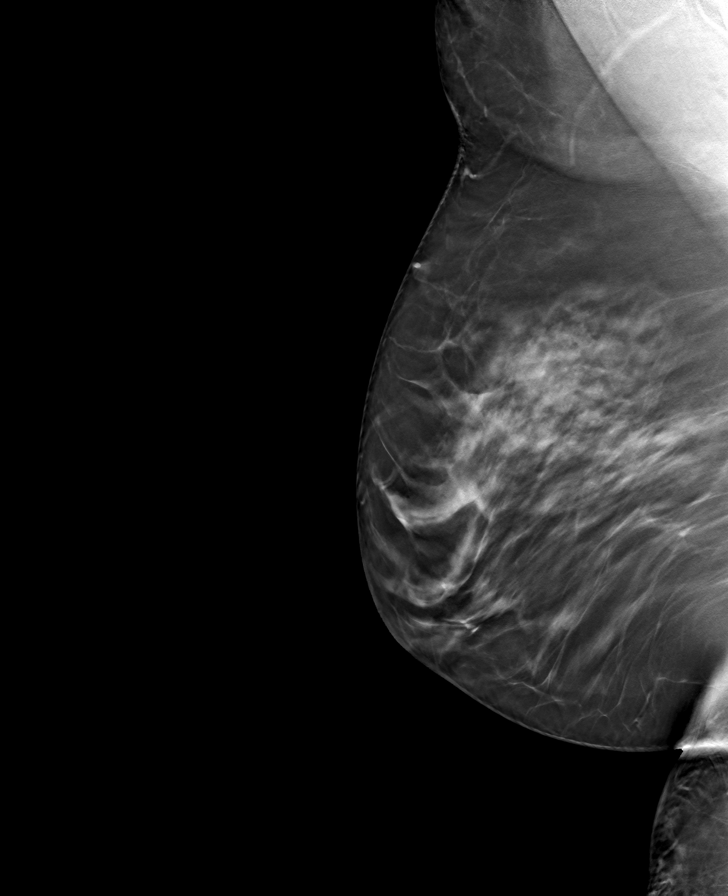

[R CC tomo · tomo slice 40/79.0]
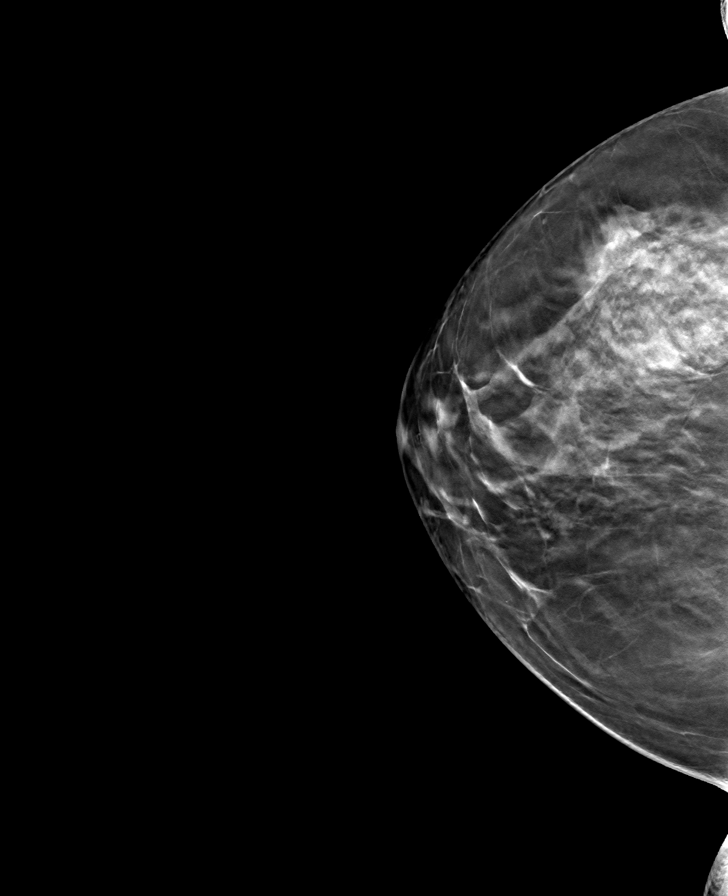

[L CC tomo · tomo slice 42/83.0]
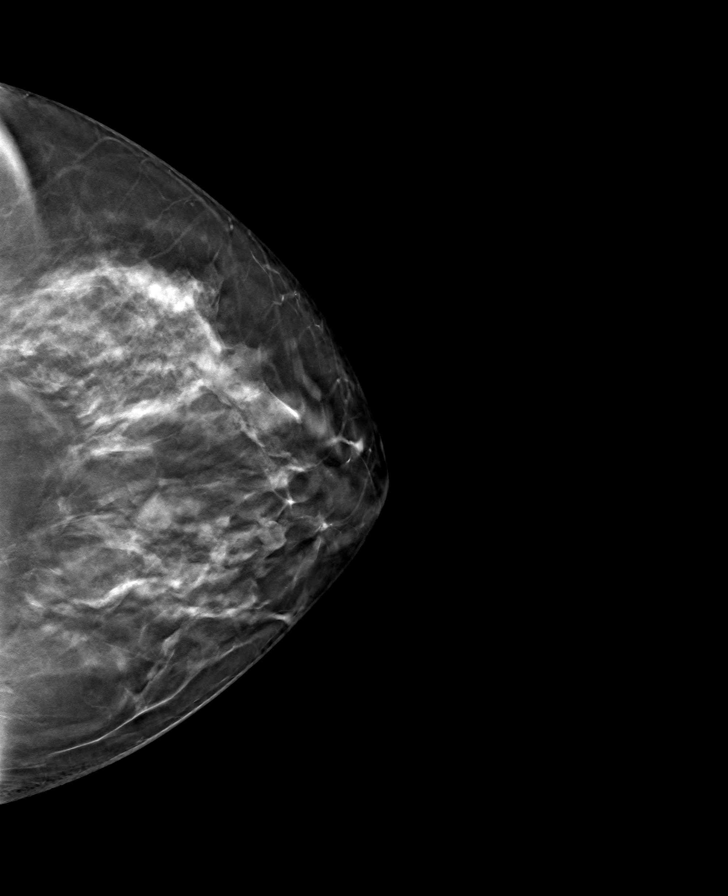

[L MLO tomo · tomo slice 41/80.0]
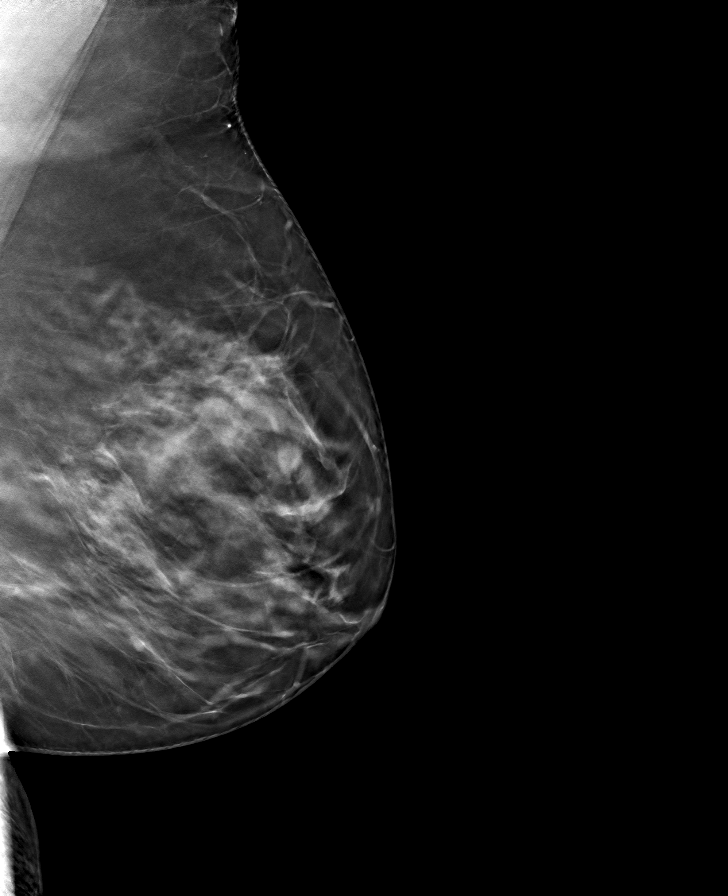

[8 of 24 positions shown; findings below may reference images not displayed]

ACR Breast Density Category c: The breast tissue is heterogeneously
dense, which may obscure small masses.
FINDINGS: There are no findings suspicious for malignancy.
IMPRESSION: No mammographic evidence of malignancy. A result letter of this
screening mammogram will be mailed directly to the patient.

RECOMMENDATION:
Screening mammogram in one year. (Code:Q3-W-BC3)

BI-RADS CATEGORY  1: Negative.
